# Patient Record
Sex: Male | Born: 1965
Health system: Southern US, Community
[De-identification: ages and names within clinical notes are randomized; demographics above are authoritative.]

## PROBLEM LIST (undated history)

## (undated) DIAGNOSIS — F1410 Cocaine abuse, uncomplicated: Secondary | ICD-10-CM

## (undated) DIAGNOSIS — Z720 Tobacco use: Secondary | ICD-10-CM

## (undated) DIAGNOSIS — I10 Essential (primary) hypertension: Secondary | ICD-10-CM

## (undated) DIAGNOSIS — G4730 Sleep apnea, unspecified: Secondary | ICD-10-CM

## (undated) DIAGNOSIS — I471 Supraventricular tachycardia, unspecified: Secondary | ICD-10-CM

## (undated) DIAGNOSIS — F1010 Alcohol abuse, uncomplicated: Secondary | ICD-10-CM

## (undated) DIAGNOSIS — R931 Abnormal findings on diagnostic imaging of heart and coronary circulation: Secondary | ICD-10-CM

## (undated) DIAGNOSIS — E119 Type 2 diabetes mellitus without complications: Secondary | ICD-10-CM

## (undated) HISTORY — PX: COLOSTOMY TAKEDOWN: SHX5783

---

## 1993-02-25 HISTORY — PX: COLOSTOMY: SHX63

## 2019-04-15 ENCOUNTER — Other Ambulatory Visit: Payer: Self-pay

## 2019-04-15 ENCOUNTER — Emergency Department (HOSPITAL_COMMUNITY)
Admission: EM | Admit: 2019-04-15 | Discharge: 2019-04-16 | Disposition: A | Discharge status: Home / Self Care | Payer: Self-pay | Attending: Emergency Medicine | Admitting: Emergency Medicine

## 2019-04-15 DIAGNOSIS — Z4800 Encounter for change or removal of nonsurgical wound dressing: Secondary | ICD-10-CM | POA: Insufficient documentation

## 2019-04-15 MED ORDER — BACITRACIN ZINC 500 UNIT/GM EX OINT
TOPICAL_OINTMENT | CUTANEOUS | Status: AC
  Filled 2019-04-15: qty 0.9

## 2019-04-15 NOTE — ED Provider Notes (Signed)
[facility] DEPT Provider Note   CSN: [id number] Arrival date & time: 04/15/19  2331     History Chief Complaint  Patient presents with  . Dressing Change    Curtis Wilkins is a 54 y.o. male.  The history is provided by the patient and medical records.   54 year old male requesting dressing change.  He had a sebaceous cyst of right axilla removed at [facility] a few days ago.  He has not changed the bandage since that time but wanted it checked before returning to work.  He is not currently on any type of antibiotic.  He has not had any increased pain, fever, or chills.  He has no other complaints.  No past medical history on file.  There are no problems to display for this patient.       No family history on file.  Social History   Tobacco Use  . Smoking status: Not on file  Substance Use Topics  . Alcohol use: Not on file  . Drug use: Not on file    Home Medications Prior to Admission medications   Not on File    Allergies    Patient has no allergy information on record.  Review of Systems   Review of Systems  Skin: Positive for wound.  All other systems reviewed and are negative.   Physical Exam Updated Vital Signs BP (!) 198/121   Pulse (!) 101   Temp 98.2 F (36.8 C)   Resp 20   Ht 5\' 9"  (1.753 m)   Wt 86.2 kg   SpO2 97%   BMI 28.06 kg/m   Physical Exam Vitals and nursing note reviewed.  Constitutional:      Appearance: He is well-developed.  HENT:     Head: Normocephalic and atraumatic.  Eyes:     Conjunctiva/sclera: Conjunctivae normal.     Pupils: Pupils are equal, round, and reactive to light.  Cardiovascular:     Rate and Rhythm: Normal rate and regular rhythm.     Heart sounds: Normal heart sounds.  Pulmonary:     Effort: Pulmonary effort is normal.     Breath sounds: Normal breath sounds.  Abdominal:     General: Bowel sounds are normal.     Palpations: Abdomen is soft.    Musculoskeletal:        General: Normal range of motion.     Cervical back: Normal range of motion.     Comments: Gauze and Tegaderm in right axilla-- this was removed and wound appears clean, there is no drainage or bleeding, surrounding skin is clean, dry, intact without cellulitis  Skin:    General: Skin is warm and dry.  Neurological:     Mental Status: He is alert and oriented to person, place, and time.     ED Results / Procedures / Treatments   Labs (all labs ordered are listed, but only abnormal results are displayed) Labs Reviewed - No data to display  EKG None  Radiology No results found.  Procedures Procedures (including critical care time)  Medications Ordered in ED Medications - No data to display  ED Course  I have reviewed the triage vital signs and the nursing notes.  Pertinent labs & imaging results that were available during my care of the patient were reviewed by me and considered in my medical decision making (see chart for details).    MDM Rules/Calculators/A&P  54 y.o. M here requesting dressing change after having sebacous cyst  removed at [facility] a few days ago.  He is due to return to work and wanted it checked.  He is afebrile, non-toxic.  Gauze and tegaderm removed from right axilla-- area is clean, dry, intact without any signs of infection.  No bleeding or drainage.  No cellulitic changes.  Dressing changed here and given extra supplies, instructed on how to properly change dressing at home.  Can follow-up with primary care doctor.  Return here for any new/acute changes.  Final Clinical Impression(s) / ED Diagnoses Final diagnoses:  Dressing change    Rx / DC Orders ED Discharge Orders    None       Garlon Hatchet, PA-C 04/16/19 0003    Nira Conn, MD 04/16/19 [phone]

## 2019-04-15 NOTE — ED Triage Notes (Signed)
Pt presents to ED from home with request to have his post op dressing change before he starts back to work because he doesn't want it to get infected. No c/o of injury or illness.

## 2019-04-16 NOTE — Discharge Instructions (Addendum)
Continue changing dressings as instructed.  I would try to change at least once a day, preferably after you shower. Follow-up with your primary care doctor. Return here for any new/acute changes.

## 2019-04-16 NOTE — ED Notes (Signed)
Pt assisted by provider in change of surgical bandage from cyst removal from right axilla. This nurse provided pt with bacitracin ointment and reinforced education on changing bandage and keeping area clean.

## 2019-04-30 ENCOUNTER — Other Ambulatory Visit: Payer: Self-pay

## 2019-04-30 ENCOUNTER — Emergency Department (HOSPITAL_COMMUNITY): Payer: Self-pay

## 2019-04-30 ENCOUNTER — Inpatient Hospital Stay (HOSPITAL_COMMUNITY)
Admission: EM | Admit: 2019-04-30 | Discharge: 2019-05-03 | DRG: 287 | Disposition: A | Discharge status: Home / Self Care | Payer: Self-pay | Attending: Cardiology | Admitting: Cardiology

## 2019-04-30 ENCOUNTER — Encounter (HOSPITAL_COMMUNITY): Payer: Self-pay | Admitting: Emergency Medicine

## 2019-04-30 DIAGNOSIS — I10 Essential (primary) hypertension: Secondary | ICD-10-CM | POA: Diagnosis present

## 2019-04-30 DIAGNOSIS — R079 Chest pain, unspecified: Secondary | ICD-10-CM | POA: Diagnosis present

## 2019-04-30 DIAGNOSIS — I471 Supraventricular tachycardia: Secondary | ICD-10-CM | POA: Diagnosis present

## 2019-04-30 DIAGNOSIS — R0789 Other chest pain: Secondary | ICD-10-CM

## 2019-04-30 DIAGNOSIS — I160 Hypertensive urgency: Secondary | ICD-10-CM

## 2019-04-30 DIAGNOSIS — E876 Hypokalemia: Secondary | ICD-10-CM | POA: Diagnosis present

## 2019-04-30 DIAGNOSIS — Z7984 Long term (current) use of oral hypoglycemic drugs: Secondary | ICD-10-CM

## 2019-04-30 DIAGNOSIS — R931 Abnormal findings on diagnostic imaging of heart and coronary circulation: Secondary | ICD-10-CM

## 2019-04-30 DIAGNOSIS — F17210 Nicotine dependence, cigarettes, uncomplicated: Secondary | ICD-10-CM | POA: Diagnosis present

## 2019-04-30 DIAGNOSIS — F1410 Cocaine abuse, uncomplicated: Secondary | ICD-10-CM | POA: Diagnosis present

## 2019-04-30 DIAGNOSIS — I161 Hypertensive emergency: Principal | ICD-10-CM | POA: Diagnosis present

## 2019-04-30 DIAGNOSIS — Z9114 Patient's other noncompliance with medication regimen: Secondary | ICD-10-CM

## 2019-04-30 DIAGNOSIS — R519 Headache, unspecified: Secondary | ICD-10-CM | POA: Diagnosis present

## 2019-04-30 DIAGNOSIS — G4733 Obstructive sleep apnea (adult) (pediatric): Secondary | ICD-10-CM | POA: Diagnosis present

## 2019-04-30 DIAGNOSIS — I252 Old myocardial infarction: Secondary | ICD-10-CM

## 2019-04-30 DIAGNOSIS — F1911 Other psychoactive substance abuse, in remission: Secondary | ICD-10-CM

## 2019-04-30 DIAGNOSIS — Z833 Family history of diabetes mellitus: Secondary | ICD-10-CM

## 2019-04-30 DIAGNOSIS — E119 Type 2 diabetes mellitus without complications: Secondary | ICD-10-CM | POA: Diagnosis present

## 2019-04-30 DIAGNOSIS — Z79899 Other long term (current) drug therapy: Secondary | ICD-10-CM

## 2019-04-30 DIAGNOSIS — Z8673 Personal history of transient ischemic attack (TIA), and cerebral infarction without residual deficits: Secondary | ICD-10-CM

## 2019-04-30 DIAGNOSIS — R778 Other specified abnormalities of plasma proteins: Secondary | ICD-10-CM

## 2019-04-30 DIAGNOSIS — Z20822 Contact with and (suspected) exposure to covid-19: Secondary | ICD-10-CM | POA: Diagnosis present

## 2019-04-30 HISTORY — DX: Cocaine abuse, uncomplicated: F14.10

## 2019-04-30 HISTORY — DX: Sleep apnea, unspecified: G47.30

## 2019-04-30 HISTORY — DX: Supraventricular tachycardia: I47.1

## 2019-04-30 HISTORY — DX: Tobacco use: Z72.0

## 2019-04-30 HISTORY — DX: Type 2 diabetes mellitus without complications: E11.9

## 2019-04-30 HISTORY — DX: Alcohol abuse, uncomplicated: F10.10

## 2019-04-30 HISTORY — DX: Abnormal findings on diagnostic imaging of heart and coronary circulation: R93.1

## 2019-04-30 HISTORY — DX: Essential (primary) hypertension: I10

## 2019-04-30 HISTORY — DX: Supraventricular tachycardia, unspecified: I47.10

## 2019-04-30 LAB — BASIC METABOLIC PANEL
Anion gap: 10 (ref 5–15)
BUN: 15 mg/dL (ref 6–20)
CO2: 26 mmol/L (ref 22–32)
Calcium: 9.2 mg/dL (ref 8.9–10.3)
Chloride: 103 mmol/L (ref 98–111)
Creatinine, Ser: 1.08 mg/dL (ref 0.61–1.24)
GFR calc Af Amer: >60 mL/min (ref >60)
GFR calc non Af Amer: >60 mL/min (ref >60)
Glucose, Bld: 192 mg/dL — ABNORMAL HIGH (ref 70–99)
Potassium: 3.3 mmol/L — ABNORMAL LOW (ref 3.5–5.1)
Sodium: 139 mmol/L (ref 135–145)

## 2019-04-30 LAB — RESPIRATORY PANEL BY RT PCR (FLU A&B, COVID)
Influenza A by PCR: NEGATIVE
Influenza B by PCR: NEGATIVE
SARS Coronavirus 2 by RT PCR: NEGATIVE

## 2019-04-30 LAB — CBC
HCT: 41.4 % (ref 39.0–52.0)
Hemoglobin: 12.9 g/dL — ABNORMAL LOW (ref 13.0–17.0)
MCH: 27.6 pg (ref 26.0–34.0)
MCHC: 31.2 g/dL (ref 30.0–36.0)
MCV: 88.7 fL (ref 80.0–100.0)
Platelets: 272 10*3/uL (ref 150–400)
RBC: 4.67 MIL/uL (ref 4.22–5.81)
RDW: 14.3 % (ref 11.5–15.5)
WBC: 7.2 10*3/uL (ref 4.0–10.5)
nRBC: 0 % (ref 0.0–0.2)

## 2019-04-30 LAB — HEMOGLOBIN A1C
Hgb A1c MFr Bld: 7.1 % — ABNORMAL HIGH (ref 4.8–5.6)
Mean Plasma Glucose: 157.07 mg/dL

## 2019-04-30 LAB — GLUCOSE, CAPILLARY
Glucose-Capillary: 189 mg/dL — ABNORMAL HIGH (ref 70–99)
Glucose-Capillary: 164 mg/dL — ABNORMAL HIGH (ref 70–99)
Glucose-Capillary: 162 mg/dL — ABNORMAL HIGH (ref 70–99)

## 2019-04-30 LAB — ECHOCARDIOGRAM COMPLETE
Height: 69 in
Weight: 3039.98 oz

## 2019-04-30 LAB — TROPONIN I (HIGH SENSITIVITY)
Troponin I (High Sensitivity): 325 ng/L (ref <18)
Troponin I (High Sensitivity): 286 ng/L (ref <18)
Troponin I (High Sensitivity): 292 ng/L (ref <18)
Troponin I (High Sensitivity): 318 ng/L (ref <18)

## 2019-04-30 LAB — HIV ANTIBODY (ROUTINE TESTING W REFLEX): HIV Screen 4th Generation wRfx: NONREACTIVE

## 2019-04-30 MED ORDER — MORPHINE SULFATE (PF) 2 MG/ML IV SOLN
2.0000 mg | INTRAVENOUS | Status: DC | PRN
  Administered 2019-04-30 – 2019-05-01 (×2): 2 mg via INTRAVENOUS
  Filled 2019-04-30 (×2): qty 1

## 2019-04-30 MED ORDER — ASPIRIN EC 81 MG PO TBEC
81.0000 mg | DELAYED_RELEASE_TABLET | Freq: Every day | ORAL | Status: DC
  Administered 2019-05-01 – 2019-05-02 (×2): 81 mg via ORAL
  Filled 2019-04-30 (×2): qty 1

## 2019-04-30 MED ORDER — MORPHINE SULFATE (PF) 4 MG/ML IV SOLN
4.0000 mg | Freq: Once | INTRAVENOUS | Status: AC
  Administered 2019-04-30: 4 mg via INTRAVENOUS
  Filled 2019-04-30: qty 1

## 2019-04-30 MED ORDER — POTASSIUM CHLORIDE CRYS ER 20 MEQ PO TBCR
40.0000 meq | EXTENDED_RELEASE_TABLET | Freq: Once | ORAL | Status: AC
  Administered 2019-04-30: 40 meq via ORAL
  Filled 2019-04-30: qty 2

## 2019-04-30 MED ORDER — AMLODIPINE BESYLATE 5 MG PO TABS
5.0000 mg | ORAL_TABLET | Freq: Every day | ORAL | Status: DC
  Administered 2019-04-30: 5 mg via ORAL
  Filled 2019-04-30: qty 1

## 2019-04-30 MED ORDER — ACETAMINOPHEN 325 MG PO TABS
650.0000 mg | ORAL_TABLET | ORAL | Status: DC | PRN
  Administered 2019-05-02: 650 mg via ORAL
  Filled 2019-04-30: qty 2

## 2019-04-30 MED ORDER — ENOXAPARIN SODIUM 40 MG/0.4ML ~~LOC~~ SOLN: 40.0000 mg | SUBCUTANEOUS | Status: DC

## 2019-04-30 MED ORDER — ATORVASTATIN CALCIUM 80 MG PO TABS
80.0000 mg | ORAL_TABLET | Freq: Every day | ORAL | Status: DC
  Administered 2019-04-30 – 2019-05-03 (×4): 80 mg via ORAL
  Filled 2019-04-30 (×4): qty 1

## 2019-04-30 MED ORDER — HYDRALAZINE HCL 10 MG PO TABS
10.0000 mg | ORAL_TABLET | Freq: Four times a day (QID) | ORAL | Status: DC | PRN
  Administered 2019-04-30 – 2019-05-03 (×6): 10 mg via ORAL
  Filled 2019-04-30 (×6): qty 1

## 2019-04-30 MED ORDER — INSULIN ASPART 100 UNIT/ML ~~LOC~~ SOLN
0.0000 [IU] | Freq: Three times a day (TID) | SUBCUTANEOUS | Status: DC
  Administered 2019-04-30: 3 [IU] via SUBCUTANEOUS
  Administered 2019-05-01 – 2019-05-03 (×3): 2 [IU] via SUBCUTANEOUS

## 2019-04-30 MED ORDER — ENOXAPARIN SODIUM 40 MG/0.4ML ~~LOC~~ SOLN
40.0000 mg | SUBCUTANEOUS | Status: DC
  Filled 2019-04-30: qty 0.4

## 2019-04-30 MED ORDER — ONDANSETRON HCL 4 MG/2ML IJ SOLN: 4.0000 mg | Freq: Four times a day (QID) | INTRAMUSCULAR | Status: DC | PRN

## 2019-04-30 MED ORDER — IOHEXOL 350 MG/ML SOLN
75.0000 mL | Freq: Once | INTRAVENOUS | Status: AC | PRN
  Administered 2019-04-30: 75 mL via INTRAVENOUS

## 2019-04-30 MED ORDER — LABETALOL HCL 5 MG/ML IV SOLN
20.0000 mg | INTRAVENOUS | Status: DC | PRN
  Administered 2019-04-30: 20 mg via INTRAVENOUS
  Filled 2019-04-30: qty 4

## 2019-04-30 MED ORDER — LOSARTAN POTASSIUM 50 MG PO TABS
50.0000 mg | ORAL_TABLET | Freq: Every day | ORAL | Status: DC
  Administered 2019-04-30 – 2019-05-01 (×2): 50 mg via ORAL
  Filled 2019-04-30 (×2): qty 1

## 2019-04-30 MED ORDER — NITROGLYCERIN 0.4 MG SL SUBL
0.4000 mg | SUBLINGUAL_TABLET | SUBLINGUAL | Status: DC | PRN
  Filled 2019-04-30: qty 1

## 2019-04-30 MED ORDER — ASPIRIN 81 MG PO CHEW: 324.0000 mg | CHEWABLE_TABLET | Freq: Once | ORAL | Status: DC

## 2019-04-30 NOTE — ED Triage Notes (Signed)
Pt arrives via EMS from home with CP since yesterday. Pain worse with deep breaths, movement and talking. Noncompliant with HTN meds. 324 ASA given by EMS but pt refused nitro.

## 2019-04-30 NOTE — Care Management (Signed)
5625 04-30-19 Case Manager received consult for medication, primary care provider (PCP) and CPAP assistance. Patient was previously in [facility] and now has been in [place] for a couple of weeks. Patient is living at [facility]- in an apartment. Director is [employer] @ [phone]. Patient currently works at [employer], but has not been on the job long enough for [employer] Patient gets transportation through a [employer] through [employer]. Case Manager did get verbal permission to contact [place] regarding transportation to appointments and they can transport the patient. Case Manager scheduled a hospital follow up appointment at the [facility]-information placed on the AVS. Patient will be able to utilize the pharmacy M-F for medication assistance. If the patient is discharged over the weekend, patient will need to use a local pharmacy for medications. Weekend Case Manager to follow for medication needs and possible MATCH. Patient states he had a sleep study at [facility] within the last 5 years. Case Manager is unable to see this information- unable to see care everywhere. CPAP cost is $853.00 and patient is not able to pay this amount at this time. Patient states he has worn CPAP's at [facility] and he hates the mask. Case Manager will continue to follow for additional transition of care needs. Gala Lewandowsky, RN,BSN Case Manager
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
## 2019-04-30 NOTE — Progress Notes (Signed)
Called that pt was having more chest pain.  He refused NTG due to bad headache.  Also BP still elevated.  I ordered IV morphine, EKG is stable, I ordered prn hydralazine po as well.  Also repeated hs troponins.

## 2019-04-30 NOTE — Progress Notes (Signed)
  Echocardiogram 2D Echocardiogram was attempted but the patient was in CT.   Curtis Wilkins 04/30/2019, 12:48 PM

## 2019-04-30 NOTE — ED Notes (Signed)
Went to ct 

## 2019-04-30 NOTE — Progress Notes (Signed)
During shift change patient stated that he was having chest chain 8 out 10 that was radiating to his back on the left side. EKG was conducted and patient refused nitroglycerin, stated that it gives him a headache. Cardiology was paged and awaiting orders.

## 2019-04-30 NOTE — Progress Notes (Signed)
  Echocardiogram 2D Echocardiogram has been performed.  Curtis Wilkins 04/30/2019, 1:51 PM

## 2019-04-30 NOTE — ED Notes (Signed)
Pt transported to CT ?

## 2019-05-01 DIAGNOSIS — R0789 Other chest pain: Secondary | ICD-10-CM

## 2019-05-01 LAB — LIPID PANEL
Cholesterol: 133 mg/dL (ref 0–200)
HDL: 55 mg/dL (ref >40)
LDL Cholesterol: 72 mg/dL (ref 0–99)
Total CHOL/HDL Ratio: 2.4 RATIO
Triglycerides: 32 mg/dL (ref <150)
VLDL: 6 mg/dL (ref 0–40)

## 2019-05-01 LAB — BASIC METABOLIC PANEL
Anion gap: 10 (ref 5–15)
BUN: 13 mg/dL (ref 6–20)
CO2: 27 mmol/L (ref 22–32)
Calcium: 8.7 mg/dL — ABNORMAL LOW (ref 8.9–10.3)
Chloride: 103 mmol/L (ref 98–111)
Creatinine, Ser: 1.09 mg/dL (ref 0.61–1.24)
GFR calc Af Amer: >60 mL/min (ref >60)
GFR calc non Af Amer: >60 mL/min (ref >60)
Glucose, Bld: 133 mg/dL — ABNORMAL HIGH (ref 70–99)
Potassium: 3.6 mmol/L (ref 3.5–5.1)
Sodium: 140 mmol/L (ref 135–145)

## 2019-05-01 LAB — RAPID URINE DRUG SCREEN, HOSP PERFORMED
Amphetamines: NOT DETECTED
Barbiturates: NOT DETECTED
Benzodiazepines: NOT DETECTED
Cocaine: NOT DETECTED
Opiates: POSITIVE — AB
Tetrahydrocannabinol: NOT DETECTED

## 2019-05-01 LAB — GLUCOSE, CAPILLARY
Glucose-Capillary: 114 mg/dL — ABNORMAL HIGH (ref 70–99)
Glucose-Capillary: 147 mg/dL — ABNORMAL HIGH (ref 70–99)
Glucose-Capillary: 149 mg/dL — ABNORMAL HIGH (ref 70–99)
Glucose-Capillary: 185 mg/dL — ABNORMAL HIGH (ref 70–99)

## 2019-05-01 MED ORDER — ALUM & MAG HYDROXIDE-SIMETH 200-200-20 MG/5ML PO SUSP
30.0000 mL | Freq: Once | ORAL | Status: AC
  Administered 2019-05-01: 30 mL via ORAL
  Filled 2019-05-01: qty 30

## 2019-05-01 MED ORDER — AMLODIPINE BESYLATE 10 MG PO TABS
10.0000 mg | ORAL_TABLET | Freq: Every day | ORAL | Status: DC
  Administered 2019-05-01 – 2019-05-03 (×3): 10 mg via ORAL
  Filled 2019-05-01 (×3): qty 1

## 2019-05-01 NOTE — Progress Notes (Signed)
Progress Note  Patient Name: Curtis Wilkins Date of Encounter: 05/01/2019  Primary Cardiologist: New  Subjective   Some ongoing chest pain symptoms, some pain with swallowing.   Inpatient Medications    Scheduled Meds: . amLODipine  5 mg Oral Daily  . aspirin  324 mg Oral Once  . aspirin EC  81 mg Oral Daily  . atorvastatin  80 mg Oral q1800  . enoxaparin (LOVENOX) injection  40 mg Subcutaneous Q24H  . insulin aspart  0-15 Units Subcutaneous TID WC  . losartan  50 mg Oral Daily   Continuous Infusions:  PRN Meds: acetaminophen, hydrALAZINE, morphine injection, nitroGLYCERIN, ondansetron (ZOFRAN) IV   Vital Signs    Vitals:   04/30/19 1800 04/30/19 2026 05/01/19 0000 05/01/19 0341  BP: (!) 177/106 (!) 148/103 (!) 149/95 (!) 142/98  Pulse:    (!) 59  Resp:      Temp:      TempSrc:      SpO2:      Weight:      Height:        Intake/Output Summary (Last 24 hours) at 05/01/2019 0719 Last data filed at 05/01/2019 0353 Gross per 24 hour  Intake 820 ml  Output 800 ml  Net 20 ml   Last 3 Weights 04/30/2019 04/15/2019  Weight (lbs) 190 lb 190 lb  Weight (kg) 86.183 kg 86.183 kg      Telemetry    SR, rare PVCs- Personally Reviewed  ECG    n/a - Personally Reviewed  Physical Exam   GEN: No acute distress.   Neck: No JVD Cardiac: RRR, no murmurs, rubs, or gallops.  Respiratory: Clear to auscultation bilaterally. GI: Soft, nontender, non-distended  MS: No edema; No deformity. Neuro:  Nonfocal  Psych: Normal affect   Labs    High Sensitivity Troponin:   Recent Labs  Lab 04/30/19 0832 04/30/19 1059 04/30/19 2030 04/30/19 2235  TROPONINIHS 325* 286* 292* 318*      Chemistry Recent Labs  Lab 04/30/19 0832 05/01/19 0345  NA 139 140  K 3.3* 3.6  CL 103 103  CO2 26 27  GLUCOSE 192* 133*  BUN 15 13  CREATININE 1.08 1.09  CALCIUM 9.2 8.7*  GFRNONAA >60 >60  GFRAA >60 >60  ANIONGAP 10 10     Hematology Recent Labs  Lab 04/30/19 0832  WBC 7.2   RBC 4.67  HGB 12.9*  HCT 41.4  MCV 88.7  MCH 27.6  MCHC 31.2  RDW 14.3  PLT 272    BNPNo results for input(s): BNP, PROBNP in the last 168 hours.   DDimer No results for input(s): DDIMER in the last 168 hours.   Radiology    DG Chest 2 View  Result Date: 04/30/2019 CLINICAL DATA:  Chest pain. EXAM: CHEST - 2 VIEW COMPARISON:  None. FINDINGS: Mild cardiomegaly. No pneumothorax or pleural effusion is noted. Both lungs are clear. The visualized skeletal structures are unremarkable. IMPRESSION: No active cardiopulmonary disease. Electronically Signed   By: Lupita Raider M.D.   On: 04/30/2019 09:13   CT Angio Chest Aorta w/CM &/OR wo/CM  Result Date: 04/30/2019 CLINICAL DATA:  Chest pain EXAM: CT ANGIOGRAPHY CHEST WITH CONTRAST TECHNIQUE: Multidetector CT imaging of the chest was performed using the standard protocol during bolus administration of intravenous contrast. Multiplanar CT image reconstructions and MIPs were obtained to evaluate the vascular anatomy. CONTRAST:  25mL OMNIPAQUE IOHEXOL 350 MG/ML SOLN COMPARISON:  None. FINDINGS: Cardiovascular: Preferential opacification of the thoracic aorta.  There is no evidence of aortic dissection. Ectasia of the ascending thoracic aorta measuring 3.9 cm. No aneurysm. Normal heart size. No pericardial effusion. Mediastinum/Nodes: There is no hilar, mediastinal, or axillary adenopathy. Lungs/Pleura: Mild bibasilar atelectasis. No consolidation or mass. No pleural effusion or pneumothorax. Upper Abdomen: No acute abnormality. Musculoskeletal: No chest wall abnormality. No acute or significant osseous findings. Review of the MIP images confirms the above findings. IMPRESSION: No evidence of aortic dissection or other acute abnormality. Electronically Signed   By: Guadlupe Spanish M.D.   On: 04/30/2019 13:18   ECHOCARDIOGRAM COMPLETE  Result Date: 04/30/2019    ECHOCARDIOGRAM REPORT   Patient Name:   Curtis Wilkins Date of Exam: 04/30/2019 Medical Rec #:   [id number]      Height:       69.0 in Accession #:    [id number]     Weight:       190.0 lb Date of Birth:  1966-02-14       BSA:          2.021 m Patient Age:    54 years       BP:           140/97 mmHg Patient Gender: M              HR:           52 bpm. Exam Location:  Inpatient Procedure: 2D Echo Indications:    Chest Pain R07.9  History:        Patient has no prior history of Echocardiogram examinations.                 Risk Factors:Hypertension, Diabetes, Current Smoker and Cocaine                 abuse.  Sonographer:    Thurman Coyer RDCS (AE) Referring Phys: [id number] CALLIE E GOODRICH IMPRESSIONS  1. Low normal LV systolic function; severe LVH; grade 2 diastolic dysfunction; mild LAE; trace AI; speckled appearance of myocardium; suggest cardiac MRI or PYP scan to R/O amyloid.  2. Left ventricular ejection fraction, by estimation, is 50 to 55%. The left ventricle has low normal function. The left ventricle has no regional wall motion abnormalities. There is severe left ventricular hypertrophy. Left ventricular diastolic parameters are consistent with Grade II diastolic dysfunction (pseudonormalization). Elevated left atrial pressure.  3. Right ventricular systolic function is normal. The right ventricular size is normal.  4. Left atrial size was mildly dilated.  5. The mitral valve is normal in structure and function. Trivial mitral valve regurgitation. No evidence of mitral stenosis.  6. The aortic valve is tricuspid. Aortic valve regurgitation is trivial. No aortic stenosis is present.  7. The inferior vena cava is normal in size with greater than 50% respiratory variability, suggesting right atrial pressure of 3 mmHg. FINDINGS  Left Ventricle: Left ventricular ejection fraction, by estimation, is 50 to 55%. The left ventricle has low normal function. The left ventricle has no regional wall motion abnormalities. The left ventricular internal cavity size was normal in size. There is severe left ventricular  hypertrophy. Left ventricular diastolic parameters are consistent with Grade II diastolic dysfunction (pseudonormalization). Elevated left atrial pressure. Right Ventricle: The right ventricular size is normal. Right ventricular systolic function is normal. Left Atrium: Left atrial size was mildly dilated. Right Atrium: Right atrial size was normal in size. Pericardium: Trivial pericardial effusion is present. Mitral Valve: The mitral valve is normal in structure and function. Normal mobility of  the mitral valve leaflets. Trivial mitral valve regurgitation. No evidence of mitral valve stenosis. Tricuspid Valve: The tricuspid valve is normal in structure. Tricuspid valve regurgitation is trivial. No evidence of tricuspid stenosis. Aortic Valve: The aortic valve is tricuspid. Aortic valve regurgitation is trivial. No aortic stenosis is present. Pulmonic Valve: The pulmonic valve was normal in structure. Pulmonic valve regurgitation is not visualized. No evidence of pulmonic stenosis. Aorta: The aortic root is normal in size and structure. Venous: The inferior vena cava is normal in size with greater than 50% respiratory variability, suggesting right atrial pressure of 3 mmHg. IAS/Shunts: No atrial level shunt detected by color flow Doppler. Additional Comments: Low normal LV systolic function; severe LVH; grade 2 diastolic dysfunction; mild LAE; trace AI; speckled appearance of myocardium; suggest cardiac MRI or PYP scan to R/O amyloid.  LEFT VENTRICLE PLAX 2D LVIDd:         4.80 cm  Diastology LVIDs:         3.20 cm  LV e' lateral:   4.57 cm/s LV PW:         2.00 cm  LV E/e' lateral: 20.6 LV IVS:        1.70 cm  LV e' medial:    4.13 cm/s LVOT diam:     2.30 cm  LV E/e' medial:  22.8 LV SV:         88 LV SV Index:   43 LVOT Area:     4.15 cm  RIGHT VENTRICLE RV S prime:     13.40 cm/s TAPSE (M-mode): 2.1 cm LEFT ATRIUM             Index       RIGHT ATRIUM           Index LA diam:        4.80 cm 2.37 cm/m  RA Area:      15.50 cm LA Vol (A2C):   51.5 ml 25.48 ml/m RA Volume:   37.10 ml  18.35 ml/m LA Vol (A4C):   77.8 ml 38.49 ml/m LA Biplane Vol: 65.5 ml 32.40 ml/m  AORTIC VALVE LVOT Vmax:   96.90 cm/s LVOT Vmean:  65.600 cm/s LVOT VTI:    0.211 m  AORTA Ao Root diam: 3.60 cm MITRAL VALVE MV Area (PHT): 2.66 cm    SHUNTS MV Decel Time: 285 msec    Systemic VTI:  0.21 m MV E velocity: 94.00 cm/s  Systemic Diam: 2.30 cm MV A velocity: 73.70 cm/s MV E/A ratio:  1.28 Kirk Ruths MD Electronically signed by Kirk Ruths MD Signature Date/Time: 04/30/2019/4:45:11 PM    Final     Cardiac Studies    Patient Profile     Curtis Wilkins is a 54 y.o. male with a history of two self-reported MIs (cardiac catheterization reportedly showed normal coronaries), stroke, hypertension non-compliant with medications, recently diagnosed type 2 diabetes mellitus, sleep apnea not on CPAP, and polysubstance abuse (tobacco, alcohol, and cocaine) who presents today for chest pain.  Assessment & Plan    1. Chest pain with elevated troponin - in setting of severe HTN, SBPs >200 - peak trop 325, slow downtrend then recurrent uptrend to 318 - EKG LVH,lateral and inferior TWIs possible strain - echo severe LVH, grade II DDX, LVEF 50-55%. Speckled appearance of myocardium reported, recs for MRI or PYP scan. Can plan on as outpatient - Patient states he has had two MIs before but reportedly had no CAD on cardiac catheterization either time. Both  of these episodes occurred at outside hospitals - CTA no aortic dissection  - from admission note plan is for cath on Monday - current medical therapy with norvasc 5, ASA 81, atorva 80, losartan 50. No beta blocker due to bradycardia - ongoing pain, remains atypical. This AM reports some pain with eating/swallowing, will try GI cocktail. Pain also somewhat worst with deep breathing and movement.    2. History of substance abuse - patien reports last time he used cocaine was 2 months  ago - UDS here not collected, reorder   3. Hypertensive urgency - SBPs in 220s on admission, noncompliant with home meds - bp's improving but   remain elevated, increase norvasc to 10mg  daily   We will need to get social workers or case workers involved to see if we can help him with medications and if there is any way he can get help with CPAP.  Social situation: He is temporarily in [place] and could benefit from primary care provider if he is going to be staying locally for any length of time   For questions or updates, please contact [facility] Please consult [email or web address] for contact info under        Signed, [place], MD  05/01/2019, 7:19 AM

## 2019-05-02 LAB — GLUCOSE, CAPILLARY
Glucose-Capillary: 207 mg/dL — ABNORMAL HIGH (ref 70–99)
Glucose-Capillary: 107 mg/dL — ABNORMAL HIGH (ref 70–99)
Glucose-Capillary: 154 mg/dL — ABNORMAL HIGH (ref 70–99)
Glucose-Capillary: 217 mg/dL — ABNORMAL HIGH (ref 70–99)

## 2019-05-02 MED ORDER — ASPIRIN 81 MG PO CHEW
81.0000 mg | CHEWABLE_TABLET | ORAL | Status: AC
  Administered 2019-05-03: 81 mg via ORAL
  Filled 2019-05-02: qty 1

## 2019-05-02 MED ORDER — SODIUM CHLORIDE 0.9% FLUSH
3.0000 mL | Freq: Two times a day (BID) | INTRAVENOUS | Status: DC
  Administered 2019-05-02 – 2019-05-03 (×2): 3 mL via INTRAVENOUS

## 2019-05-02 MED ORDER — SODIUM CHLORIDE 0.9 % WEIGHT BASED INFUSION
3.0000 mL/kg/h | INTRAVENOUS | Status: DC
  Administered 2019-05-03: 3 mL/kg/h via INTRAVENOUS

## 2019-05-02 MED ORDER — SODIUM CHLORIDE 0.9% FLUSH: 3.0000 mL | INTRAVENOUS | Status: DC | PRN

## 2019-05-02 MED ORDER — SODIUM CHLORIDE 0.9 % WEIGHT BASED INFUSION
1.0000 mL/kg/h | INTRAVENOUS | Status: DC
  Administered 2019-05-03: 1 mL/kg/h via INTRAVENOUS

## 2019-05-02 MED ORDER — SODIUM CHLORIDE 0.9 % IV SOLN: 250.0000 mL | INTRAVENOUS | Status: DC | PRN

## 2019-05-02 MED ORDER — LOSARTAN POTASSIUM 50 MG PO TABS
100.0000 mg | ORAL_TABLET | Freq: Every day | ORAL | Status: DC
  Administered 2019-05-02 – 2019-05-03 (×2): 100 mg via ORAL
  Filled 2019-05-02 (×2): qty 2

## 2019-05-02 NOTE — H&P (View-Only) (Signed)
Progress Note  Patient Name: Curtis Wilkins Date of Encounter: 05/02/2019  Primary Cardiologist: New  Subjective   Mild chest pain left sided  Inpatient Medications    Scheduled Meds: . amLODipine  10 mg Oral Daily  . aspirin  324 mg Oral Once  . aspirin EC  81 mg Oral Daily  . atorvastatin  80 mg Oral q1800  . enoxaparin (LOVENOX) injection  40 mg Subcutaneous Q24H  . insulin aspart  0-15 Units Subcutaneous TID WC  . losartan  50 mg Oral Daily   Continuous Infusions:  PRN Meds: acetaminophen, hydrALAZINE, morphine injection, nitroGLYCERIN, ondansetron (ZOFRAN) IV   Vital Signs    Vitals:   05/01/19 2114 05/01/19 2143 05/02/19 0336 05/02/19 0500  BP: (!) 178/103 (!) 176/108 (!) 169/106 (!) 147/107  Pulse: 79 71  63  Resp:  17  17  Temp:  98 F (36.7 C)  97.6 F (36.4 C)  TempSrc:  Oral  Oral  SpO2: 95% 98%  98%  Weight:    90.2 kg  Height:        Intake/Output Summary (Last 24 hours) at 05/02/2019 0800 Last data filed at 05/02/2019 0100 Gross per 24 hour  Intake 840 ml  Output 1625 ml  Net -785 ml   Last 3 Weights 05/02/2019 04/30/2019 04/15/2019  Weight (lbs) 198 lb 14.4 oz 190 lb 190 lb  Weight (kg) 90.22 kg 86.183 kg 86.183 kg      Telemetry    SR - Personally Reviewed  ECG    n/a- Personally Reviewed  Physical Exam   GEN: No acute distress.   Neck: No JVD Cardiac: RRR, no murmurs, rubs, or gallops.  Respiratory: Clear to auscultation bilaterally. GI: Soft, nontender, non-distended  MS: No edema; No deformity. Neuro:  Nonfocal  Psych: Normal affect   Labs    High Sensitivity Troponin:   Recent Labs  Lab 04/30/19 0832 04/30/19 1059 04/30/19 2030 04/30/19 2235  TROPONINIHS 325* 286* 292* 318*      Chemistry Recent Labs  Lab 04/30/19 0832 05/01/19 0345  NA 139 140  K 3.3* 3.6  CL 103 103  CO2 26 27  GLUCOSE 192* 133*  BUN 15 13  CREATININE 1.08 1.09  CALCIUM 9.2 8.7*  GFRNONAA >60 >60  GFRAA >60 >60  ANIONGAP 10 10      Hematology Recent Labs  Lab 04/30/19 0832  WBC 7.2  RBC 4.67  HGB 12.9*  HCT 41.4  MCV 88.7  MCH 27.6  MCHC 31.2  RDW 14.3  PLT 272    BNPNo results for input(s): BNP, PROBNP in the last 168 hours.   DDimer No results for input(s): DDIMER in the last 168 hours.   Radiology    DG Chest 2 View  Result Date: 04/30/2019 CLINICAL DATA:  Chest pain. EXAM: CHEST - 2 VIEW COMPARISON:  None. FINDINGS: Mild cardiomegaly. No pneumothorax or pleural effusion is noted. Both lungs are clear. The visualized skeletal structures are unremarkable. IMPRESSION: No active cardiopulmonary disease. Electronically Signed   By: Lupita Raider M.D.   On: 04/30/2019 09:13   CT Angio Chest Aorta w/CM &/OR wo/CM  Result Date: 04/30/2019 CLINICAL DATA:  Chest pain EXAM: CT ANGIOGRAPHY CHEST WITH CONTRAST TECHNIQUE: Multidetector CT imaging of the chest was performed using the standard protocol during bolus administration of intravenous contrast. Multiplanar CT image reconstructions and MIPs were obtained to evaluate the vascular anatomy. CONTRAST:  21mL OMNIPAQUE IOHEXOL 350 MG/ML SOLN COMPARISON:  None. FINDINGS: Cardiovascular:  Preferential opacification of the thoracic aorta. There is no evidence of aortic dissection. Ectasia of the ascending thoracic aorta measuring 3.9 cm. No aneurysm. Normal heart size. No pericardial effusion. Mediastinum/Nodes: There is no hilar, mediastinal, or axillary adenopathy. Lungs/Pleura: Mild bibasilar atelectasis. No consolidation or mass. No pleural effusion or pneumothorax. Upper Abdomen: No acute abnormality. Musculoskeletal: No chest wall abnormality. No acute or significant osseous findings. Review of the MIP images confirms the above findings. IMPRESSION: No evidence of aortic dissection or other acute abnormality. Electronically Signed   By: Guadlupe Spanish M.D.   On: 04/30/2019 13:18   ECHOCARDIOGRAM COMPLETE  Result Date: 04/30/2019    ECHOCARDIOGRAM REPORT   Patient  Name:   Curtis Wilkins Date of Exam: 04/30/2019 Medical Rec #:  [id number]      Height:       69.0 in Accession #:    [id number]     Weight:       190.0 lb Date of Birth:  [birth year]       BSA:          2.021 m Patient Age:    54 years       BP:           140/97 mmHg Patient Gender: M              HR:           52 bpm. Exam Location:  Inpatient Procedure: 2D Echo Indications:    Chest Pain R07.9  History:        Patient has no prior history of Echocardiogram examinations.                 Risk Factors:Hypertension, Diabetes, Current Smoker and Cocaine                 abuse.  Sonographer:    Thurman Coyer RDCS (AE) Referring Phys: [id number] CALLIE E GOODRICH IMPRESSIONS  1. Low normal LV systolic function; severe LVH; grade 2 diastolic dysfunction; mild LAE; trace AI; speckled appearance of myocardium; suggest cardiac MRI or PYP scan to R/O amyloid.  2. Left ventricular ejection fraction, by estimation, is 50 to 55%. The left ventricle has low normal function. The left ventricle has no regional wall motion abnormalities. There is severe left ventricular hypertrophy. Left ventricular diastolic parameters are consistent with Grade II diastolic dysfunction (pseudonormalization). Elevated left atrial pressure.  3. Right ventricular systolic function is normal. The right ventricular size is normal.  4. Left atrial size was mildly dilated.  5. The mitral valve is normal in structure and function. Trivial mitral valve regurgitation. No evidence of mitral stenosis.  6. The aortic valve is tricuspid. Aortic valve regurgitation is trivial. No aortic stenosis is present.  7. The inferior vena cava is normal in size with greater than 50% respiratory variability, suggesting right atrial pressure of 3 mmHg. FINDINGS  Left Ventricle: Left ventricular ejection fraction, by estimation, is 50 to 55%. The left ventricle has low normal function. The left ventricle has no regional wall motion abnormalities. The left ventricular internal  cavity size was normal in size. There is severe left ventricular hypertrophy. Left ventricular diastolic parameters are consistent with Grade II diastolic dysfunction (pseudonormalization). Elevated left atrial pressure. Right Ventricle: The right ventricular size is normal. Right ventricular systolic function is normal. Left Atrium: Left atrial size was mildly dilated. Right Atrium: Right atrial size was normal in size. Pericardium: Trivial pericardial effusion is present. Mitral Valve: The mitral valve is normal in  structure and function. Normal mobility of the mitral valve leaflets. Trivial mitral valve regurgitation. No evidence of mitral valve stenosis. Tricuspid Valve: The tricuspid valve is normal in structure. Tricuspid valve regurgitation is trivial. No evidence of tricuspid stenosis. Aortic Valve: The aortic valve is tricuspid. Aortic valve regurgitation is trivial. No aortic stenosis is present. Pulmonic Valve: The pulmonic valve was normal in structure. Pulmonic valve regurgitation is not visualized. No evidence of pulmonic stenosis. Aorta: The aortic root is normal in size and structure. Venous: The inferior vena cava is normal in size with greater than 50% respiratory variability, suggesting right atrial pressure of 3 mmHg. IAS/Shunts: No atrial level shunt detected by color flow Doppler. Additional Comments: Low normal LV systolic function; severe LVH; grade 2 diastolic dysfunction; mild LAE; trace AI; speckled appearance of myocardium; suggest cardiac MRI or PYP scan to R/O amyloid.  LEFT VENTRICLE PLAX 2D LVIDd:         4.80 cm  Diastology LVIDs:         3.20 cm  LV e' lateral:   4.57 cm/s LV PW:         2.00 cm  LV E/e' lateral: 20.6 LV IVS:        1.70 cm  LV e' medial:    4.13 cm/s LVOT diam:     2.30 cm  LV E/e' medial:  22.8 LV SV:         88 LV SV Index:   43 LVOT Area:     4.15 cm  RIGHT VENTRICLE RV S prime:     13.40 cm/s TAPSE (M-mode): 2.1 cm LEFT ATRIUM             Index       RIGHT  ATRIUM           Index LA diam:        4.80 cm 2.37 cm/m  RA Area:     15.50 cm LA Vol (A2C):   51.5 ml 25.48 ml/m RA Volume:   37.10 ml  18.35 ml/m LA Vol (A4C):   77.8 ml 38.49 ml/m LA Biplane Vol: 65.5 ml 32.40 ml/m  AORTIC VALVE LVOT Vmax:   96.90 cm/s LVOT Vmean:  65.600 cm/s LVOT VTI:    0.211 m  AORTA Ao Root diam: 3.60 cm MITRAL VALVE MV Area (PHT): 2.66 cm    SHUNTS MV Decel Time: 285 msec    Systemic VTI:  0.21 m MV E velocity: 94.00 cm/s  Systemic Diam: 2.30 cm MV A velocity: 73.70 cm/s MV E/A ratio:  1.28 Kirk Ruths MD Electronically signed by Kirk Ruths MD Signature Date/Time: 04/30/2019/4:45:11 PM    Final     Cardiac Studies     Patient Profile     Broughton Eppinger a 54 y.o.malewith a history oftwoself-reported MIs(cardiac catheterization reportedly showed normal coronaries), stroke,hypertension non-compliant with medications, recently diagnosed type 2diabetes mellitus, sleep apneanot on CPAP, and polysubstance abuse (tobacco, alcohol, and cocaine) who presents today for chest pain.  Assessment & Plan    1. Chest pain with elevated troponin - in setting of severe HTN, SBPs >200 - peak trop 325, slow downtrend then recurrent uptrend to 318 - EKG LVH,lateral and inferior TWIs possible strain - echo severe LVH, grade II DDX, LVEF 50-55%. Speckled appearance of myocardium reported, recs for MRI or PYP scan. Can plan on as outpatient - Patient states he has had two MIs before but reportedly had no CAD on cardiac catheterization either time. Both of these  episodes occurred at outside hospitals - CTA no aortic dissection - symptoms atypical, had some relief with GI cocktail yesterday  - from admission note plan is for cath on Monday given overall findings - current medical therapy with norvasc 5, ASA 81, atorva 80, losartan 50. No beta blocker due to bradycardia    2. History of substance abuse - patien reports last time he used cocaine was 2 months ago -  UDS here not collected, reorder   3. Hypertensive urgency - SBPs in 220s on admission, noncompliant with home meds - remains elevated, increase losartan to 100mg  daily   We will need to get social workers or case workers involved to see if we can help him with medications and if there is any way he can get help with CPAP before discharge. Social situation: He is temporarily in [place] and could benefit from primary care provider if he is going to be staying locally for any length of time   I have reviewed the risks, indications, and alternatives to cardiac catheterization, possible angioplasty, and stenting with the patient today. Risks include but are not limited to bleeding, infection, vascular injury, stroke, myocardial infection, arrhythmia, kidney injury, radiation-related injury in the case of prolonged fluoroscopy use, emergency cardiac surgery, and death. The patient understands the risks of serious complication is 1-2 in 1000 with diagnostic cardiac cath and 1-2% or less with angioplasty/stenting.   For questions or updates, please contact [facility] Please consult [email or web address] for contact info under        Signed, [place], MD  05/02/2019, 8:00 AM

## 2019-05-02 NOTE — Progress Notes (Signed)
Progress Note  Patient Name: Curtis Wilkins Date of Encounter: 05/02/2019  Primary Cardiologist: New  Subjective   Mild chest pain left sided  Inpatient Medications    Scheduled Meds: . amLODipine  10 mg Oral Daily  . aspirin  324 mg Oral Once  . aspirin EC  81 mg Oral Daily  . atorvastatin  80 mg Oral q1800  . enoxaparin (LOVENOX) injection  40 mg Subcutaneous Q24H  . insulin aspart  0-15 Units Subcutaneous TID WC  . losartan  50 mg Oral Daily   Continuous Infusions:  PRN Meds: acetaminophen, hydrALAZINE, morphine injection, nitroGLYCERIN, ondansetron (ZOFRAN) IV   Vital Signs    Vitals:   05/01/19 2114 05/01/19 2143 05/02/19 0336 05/02/19 0500  BP: (!) 178/103 (!) 176/108 (!) 169/106 (!) 147/107  Pulse: 79 71  63  Resp:  17  17  Temp:  98 F (36.7 C)  97.6 F (36.4 C)  TempSrc:  Oral  Oral  SpO2: 95% 98%  98%  Weight:    90.2 kg  Height:        Intake/Output Summary (Last 24 hours) at 05/02/2019 0800 Last data filed at 05/02/2019 0100 Gross per 24 hour  Intake 840 ml  Output 1625 ml  Net -785 ml   Last 3 Weights 05/02/2019 04/30/2019 04/15/2019  Weight (lbs) 198 lb 14.4 oz 190 lb 190 lb  Weight (kg) 90.22 kg 86.183 kg 86.183 kg      Telemetry    SR - Personally Reviewed  ECG    n/a- Personally Reviewed  Physical Exam   GEN: No acute distress.   Neck: No JVD Cardiac: RRR, no murmurs, rubs, or gallops.  Respiratory: Clear to auscultation bilaterally. GI: Soft, nontender, non-distended  MS: No edema; No deformity. Neuro:  Nonfocal  Psych: Normal affect   Labs    High Sensitivity Troponin:   Recent Labs  Lab 04/30/19 0832 04/30/19 1059 04/30/19 2030 04/30/19 2235  TROPONINIHS 325* 286* 292* 318*      Chemistry Recent Labs  Lab 04/30/19 0832 05/01/19 0345  NA 139 140  K 3.3* 3.6  CL 103 103  CO2 26 27  GLUCOSE 192* 133*  BUN 15 13  CREATININE 1.08 1.09  CALCIUM 9.2 8.7*  GFRNONAA >60 >60  GFRAA >60 >60  ANIONGAP 10 10      Hematology Recent Labs  Lab 04/30/19 0832  WBC 7.2  RBC 4.67  HGB 12.9*  HCT 41.4  MCV 88.7  MCH 27.6  MCHC 31.2  RDW 14.3  PLT 272    BNPNo results for input(s): BNP, PROBNP in the last 168 hours.   DDimer No results for input(s): DDIMER in the last 168 hours.   Radiology    DG Chest 2 View  Result Date: 04/30/2019 CLINICAL DATA:  Chest pain. EXAM: CHEST - 2 VIEW COMPARISON:  None. FINDINGS: Mild cardiomegaly. No pneumothorax or pleural effusion is noted. Both lungs are clear. The visualized skeletal structures are unremarkable. IMPRESSION: No active cardiopulmonary disease. Electronically Signed   By: Lupita Raider M.D.   On: 04/30/2019 09:13   CT Angio Chest Aorta w/CM &/OR wo/CM  Result Date: 04/30/2019 CLINICAL DATA:  Chest pain EXAM: CT ANGIOGRAPHY CHEST WITH CONTRAST TECHNIQUE: Multidetector CT imaging of the chest was performed using the standard protocol during bolus administration of intravenous contrast. Multiplanar CT image reconstructions and MIPs were obtained to evaluate the vascular anatomy. CONTRAST:  21mL OMNIPAQUE IOHEXOL 350 MG/ML SOLN COMPARISON:  None. FINDINGS: Cardiovascular:  Preferential opacification of the thoracic aorta. There is no evidence of aortic dissection. Ectasia of the ascending thoracic aorta measuring 3.9 cm. No aneurysm. Normal heart size. No pericardial effusion. Mediastinum/Nodes: There is no hilar, mediastinal, or axillary adenopathy. Lungs/Pleura: Mild bibasilar atelectasis. No consolidation or mass. No pleural effusion or pneumothorax. Upper Abdomen: No acute abnormality. Musculoskeletal: No chest wall abnormality. No acute or significant osseous findings. Review of the MIP images confirms the above findings. IMPRESSION: No evidence of aortic dissection or other acute abnormality. Electronically Signed   By: Guadlupe Spanish M.D.   On: 04/30/2019 13:18   ECHOCARDIOGRAM COMPLETE  Result Date: 04/30/2019    ECHOCARDIOGRAM REPORT   Patient  Name:   Curtis Wilkins Date of Exam: 04/30/2019 Medical Rec #:  [id number]      Height:       69.0 in Accession #:    [id number]     Weight:       190.0 lb Date of Birth:  [birth year]       BSA:          2.021 m Patient Age:    54 years       BP:           140/97 mmHg Patient Gender: M              HR:           52 bpm. Exam Location:  Inpatient Procedure: 2D Echo Indications:    Chest Pain R07.9  History:        Patient has no prior history of Echocardiogram examinations.                 Risk Factors:Hypertension, Diabetes, Current Smoker and Cocaine                 abuse.  Sonographer:    Thurman Coyer RDCS (AE) Referring Phys: [id number] CALLIE E GOODRICH IMPRESSIONS  1. Low normal LV systolic function; severe LVH; grade 2 diastolic dysfunction; mild LAE; trace AI; speckled appearance of myocardium; suggest cardiac MRI or PYP scan to R/O amyloid.  2. Left ventricular ejection fraction, by estimation, is 50 to 55%. The left ventricle has low normal function. The left ventricle has no regional wall motion abnormalities. There is severe left ventricular hypertrophy. Left ventricular diastolic parameters are consistent with Grade II diastolic dysfunction (pseudonormalization). Elevated left atrial pressure.  3. Right ventricular systolic function is normal. The right ventricular size is normal.  4. Left atrial size was mildly dilated.  5. The mitral valve is normal in structure and function. Trivial mitral valve regurgitation. No evidence of mitral stenosis.  6. The aortic valve is tricuspid. Aortic valve regurgitation is trivial. No aortic stenosis is present.  7. The inferior vena cava is normal in size with greater than 50% respiratory variability, suggesting right atrial pressure of 3 mmHg. FINDINGS  Left Ventricle: Left ventricular ejection fraction, by estimation, is 50 to 55%. The left ventricle has low normal function. The left ventricle has no regional wall motion abnormalities. The left ventricular internal  cavity size was normal in size. There is severe left ventricular hypertrophy. Left ventricular diastolic parameters are consistent with Grade II diastolic dysfunction (pseudonormalization). Elevated left atrial pressure. Right Ventricle: The right ventricular size is normal. Right ventricular systolic function is normal. Left Atrium: Left atrial size was mildly dilated. Right Atrium: Right atrial size was normal in size. Pericardium: Trivial pericardial effusion is present. Mitral Valve: The mitral valve is normal in  structure and function. Normal mobility of the mitral valve leaflets. Trivial mitral valve regurgitation. No evidence of mitral valve stenosis. Tricuspid Valve: The tricuspid valve is normal in structure. Tricuspid valve regurgitation is trivial. No evidence of tricuspid stenosis. Aortic Valve: The aortic valve is tricuspid. Aortic valve regurgitation is trivial. No aortic stenosis is present. Pulmonic Valve: The pulmonic valve was normal in structure. Pulmonic valve regurgitation is not visualized. No evidence of pulmonic stenosis. Aorta: The aortic root is normal in size and structure. Venous: The inferior vena cava is normal in size with greater than 50% respiratory variability, suggesting right atrial pressure of 3 mmHg. IAS/Shunts: No atrial level shunt detected by color flow Doppler. Additional Comments: Low normal LV systolic function; severe LVH; grade 2 diastolic dysfunction; mild LAE; trace AI; speckled appearance of myocardium; suggest cardiac MRI or PYP scan to R/O amyloid.  LEFT VENTRICLE PLAX 2D LVIDd:         4.80 cm  Diastology LVIDs:         3.20 cm  LV e' lateral:   4.57 cm/s LV PW:         2.00 cm  LV E/e' lateral: 20.6 LV IVS:        1.70 cm  LV e' medial:    4.13 cm/s LVOT diam:     2.30 cm  LV E/e' medial:  22.8 LV SV:         88 LV SV Index:   43 LVOT Area:     4.15 cm  RIGHT VENTRICLE RV S prime:     13.40 cm/s TAPSE (M-mode): 2.1 cm LEFT ATRIUM             Index       RIGHT  ATRIUM           Index LA diam:        4.80 cm 2.37 cm/m  RA Area:     15.50 cm LA Vol (A2C):   51.5 ml 25.48 ml/m RA Volume:   37.10 ml  18.35 ml/m LA Vol (A4C):   77.8 ml 38.49 ml/m LA Biplane Vol: 65.5 ml 32.40 ml/m  AORTIC VALVE LVOT Vmax:   96.90 cm/s LVOT Vmean:  65.600 cm/s LVOT VTI:    0.211 m  AORTA Ao Root diam: 3.60 cm MITRAL VALVE MV Area (PHT): 2.66 cm    SHUNTS MV Decel Time: 285 msec    Systemic VTI:  0.21 m MV E velocity: 94.00 cm/s  Systemic Diam: 2.30 cm MV A velocity: 73.70 cm/s MV E/A ratio:  1.28 Kirk Ruths MD Electronically signed by Kirk Ruths MD Signature Date/Time: 04/30/2019/4:45:11 PM    Final     Cardiac Studies     Patient Profile     Broughton Eppinger a 54 y.o.malewith a history oftwoself-reported MIs(cardiac catheterization reportedly showed normal coronaries), stroke,hypertension non-compliant with medications, recently diagnosed type 2diabetes mellitus, sleep apneanot on CPAP, and polysubstance abuse (tobacco, alcohol, and cocaine) who presents today for chest pain.  Assessment & Plan    1. Chest pain with elevated troponin - in setting of severe HTN, SBPs >200 - peak trop 325, slow downtrend then recurrent uptrend to 318 - EKG LVH,lateral and inferior TWIs possible strain - echo severe LVH, grade II DDX, LVEF 50-55%. Speckled appearance of myocardium reported, recs for MRI or PYP scan. Can plan on as outpatient - Patient states he has had two MIs before but reportedly had no CAD on cardiac catheterization either time. Both of these  episodes occurred at outside hospitals - CTA no aortic dissection - symptoms atypical, had some relief with GI cocktail yesterday  - from admission note plan is for cath on Monday given overall findings - current medical therapy with norvasc 5, ASA 81, atorva 80, losartan 50. No beta blocker due to bradycardia    2. History of substance abuse - patien reports last time he used cocaine was 2 months ago -  UDS here not collected, reorder   3. Hypertensive urgency - SBPs in 220s on admission, noncompliant with home meds - remains elevated, increase losartan to 100mg daily   We will need to get social workers or case workers involved to see if we can help him with medications and if there is any way he can get help with CPAP before discharge. Social situation: He is temporarily in [place] and could benefit from primary care provider if he is going to be staying locally for any length of time   I have reviewed the risks, indications, and alternatives to cardiac catheterization, possible angioplasty, and stenting with the patient today. Risks include but are not limited to bleeding, infection, vascular injury, stroke, myocardial infection, arrhythmia, kidney injury, radiation-related injury in the case of prolonged fluoroscopy use, emergency cardiac surgery, and death. The patient understands the risks of serious complication is 1-2 in 1000 with diagnostic cardiac cath and 1-2% or less with angioplasty/stenting.   For questions or updates, please contact [facility] Please consult [email or web address] for contact info under        Signed, Avayah Raffety, MD  05/02/2019, 8:00 AM    

## 2019-05-02 NOTE — Plan of Care (Signed)
  Problem: Education: Goal: Knowledge of General Education information will improve Description: Including pain rating scale, medication(s)/side effects and non-pharmacologic comfort measures Outcome: Progressing  Patient states he manages his blood sugar at home without insulin, refusing insulin.  Problem: Education: Goal: Understanding of cardiac disease, CV risk reduction, and recovery process will improve Outcome: Progressing  Education provided to call staff in event of any chest pain or shortness of breath.  Problem: Activity: Goal: Ability to tolerate increased activity will improve Outcome: Progressing  Patient up to bathroom several times without SOB or chest pain.

## 2019-05-03 ENCOUNTER — Inpatient Hospital Stay (HOSPITAL_COMMUNITY)
Admission: EM | Disposition: A | Discharge status: Home / Self Care | Payer: Self-pay | Source: Home / Self Care | Attending: Cardiology

## 2019-05-03 ENCOUNTER — Encounter: Payer: Self-pay | Admitting: Physician Assistant

## 2019-05-03 ENCOUNTER — Encounter (HOSPITAL_COMMUNITY): Payer: Self-pay | Admitting: Cardiology

## 2019-05-03 DIAGNOSIS — R931 Abnormal findings on diagnostic imaging of heart and coronary circulation: Secondary | ICD-10-CM

## 2019-05-03 DIAGNOSIS — R778 Other specified abnormalities of plasma proteins: Secondary | ICD-10-CM

## 2019-05-03 DIAGNOSIS — F1911 Other psychoactive substance abuse, in remission: Secondary | ICD-10-CM

## 2019-05-03 DIAGNOSIS — E119 Type 2 diabetes mellitus without complications: Secondary | ICD-10-CM

## 2019-05-03 DIAGNOSIS — I471 Supraventricular tachycardia: Secondary | ICD-10-CM

## 2019-05-03 DIAGNOSIS — R7989 Other specified abnormal findings of blood chemistry: Secondary | ICD-10-CM

## 2019-05-03 DIAGNOSIS — R071 Chest pain on breathing: Secondary | ICD-10-CM

## 2019-05-03 DIAGNOSIS — I10 Essential (primary) hypertension: Secondary | ICD-10-CM

## 2019-05-03 DIAGNOSIS — Z9114 Patient's other noncompliance with medication regimen: Secondary | ICD-10-CM

## 2019-05-03 DIAGNOSIS — G4733 Obstructive sleep apnea (adult) (pediatric): Secondary | ICD-10-CM

## 2019-05-03 HISTORY — PX: LEFT HEART CATH AND CORONARY ANGIOGRAPHY: CATH118249

## 2019-05-03 LAB — BASIC METABOLIC PANEL
Anion gap: 8 (ref 5–15)
BUN: 8 mg/dL (ref 6–20)
CO2: 28 mmol/L (ref 22–32)
Calcium: 8.7 mg/dL — ABNORMAL LOW (ref 8.9–10.3)
Chloride: 101 mmol/L (ref 98–111)
Creatinine, Ser: 0.96 mg/dL (ref 0.61–1.24)
GFR calc Af Amer: >60 mL/min (ref >60)
GFR calc non Af Amer: >60 mL/min (ref >60)
Glucose, Bld: 126 mg/dL — ABNORMAL HIGH (ref 70–99)
Potassium: 3.7 mmol/L (ref 3.5–5.1)
Sodium: 137 mmol/L (ref 135–145)

## 2019-05-03 LAB — MAGNESIUM: Magnesium: 1.9 mg/dL (ref 1.7–2.4)

## 2019-05-03 LAB — GLUCOSE, CAPILLARY
Glucose-Capillary: 111 mg/dL — ABNORMAL HIGH (ref 70–99)
Glucose-Capillary: 118 mg/dL — ABNORMAL HIGH (ref 70–99)
Glucose-Capillary: 127 mg/dL — ABNORMAL HIGH (ref 70–99)

## 2019-05-03 LAB — TSH: TSH: 1.07 u[IU]/mL (ref 0.350–4.500)

## 2019-05-03 SURGERY — LEFT HEART CATH AND CORONARY ANGIOGRAPHY
Anesthesia: LOCAL

## 2019-05-03 MED ORDER — TRAZODONE HCL 50 MG PO TABS: 150.0000 mg | ORAL_TABLET | Freq: Every day | ORAL | Status: DC

## 2019-05-03 MED ORDER — POTASSIUM CHLORIDE CRYS ER 20 MEQ PO TBCR: 40.0000 meq | EXTENDED_RELEASE_TABLET | Freq: Every day | ORAL | 6 refills | Status: AC

## 2019-05-03 MED ORDER — HEPARIN (PORCINE) IN NACL 1000-0.9 UT/500ML-% IV SOLN
INTRAVENOUS | Status: DC | PRN
  Administered 2019-05-03: 500 mL

## 2019-05-03 MED ORDER — LOSARTAN POTASSIUM 100 MG PO TABS: 100.0000 mg | ORAL_TABLET | Freq: Every day | ORAL | 6 refills | Status: AC

## 2019-05-03 MED ORDER — AMLODIPINE BESYLATE 10 MG PO TABS: 10.0000 mg | ORAL_TABLET | Freq: Every day | ORAL | 6 refills | Status: AC

## 2019-05-03 MED ORDER — ASPIRIN 81 MG PO TBEC: 81.0000 mg | DELAYED_RELEASE_TABLET | Freq: Every day | ORAL | 6 refills | Status: AC

## 2019-05-03 MED ORDER — LISINOPRIL 40 MG PO TABS: 40.0000 mg | ORAL_TABLET | Freq: Every day | ORAL | Status: DC

## 2019-05-03 MED ORDER — SODIUM CHLORIDE 0.9% FLUSH: 3.0000 mL | INTRAVENOUS | Status: DC | PRN

## 2019-05-03 MED ORDER — HEPARIN (PORCINE) IN NACL 1000-0.9 UT/500ML-% IV SOLN
INTRAVENOUS | Status: AC
  Filled 2019-05-03: qty 500

## 2019-05-03 MED ORDER — ISOSORBIDE MONONITRATE ER 60 MG PO TB24: 60.0000 mg | ORAL_TABLET | Freq: Every day | ORAL | Status: DC

## 2019-05-03 MED ORDER — HYDRALAZINE HCL 20 MG/ML IJ SOLN: 10.0000 mg | INTRAMUSCULAR | Status: DC | PRN

## 2019-05-03 MED ORDER — IOHEXOL 350 MG/ML SOLN
INTRAVENOUS | Status: AC
  Filled 2019-05-03: qty 1

## 2019-05-03 MED ORDER — DIVALPROEX SODIUM ER 500 MG PO TB24: 1000.0000 mg | ORAL_TABLET | Freq: Every day | ORAL | Status: DC

## 2019-05-03 MED ORDER — HEPARIN SODIUM (PORCINE) 1000 UNIT/ML IJ SOLN
INTRAMUSCULAR | Status: DC | PRN
  Administered 2019-05-03: 4500 [IU] via INTRAVENOUS

## 2019-05-03 MED ORDER — SERTRALINE HCL 100 MG PO TABS: 100.0000 mg | ORAL_TABLET | Freq: Every day | ORAL | Status: DC

## 2019-05-03 MED ORDER — SODIUM CHLORIDE 0.9 % IV SOLN: INTRAVENOUS | Status: DC

## 2019-05-03 MED ORDER — LIDOCAINE HCL (PF) 1 % IJ SOLN
INTRAMUSCULAR | Status: DC | PRN
  Administered 2019-05-03: 2 mL

## 2019-05-03 MED ORDER — HYDRALAZINE HCL 20 MG/ML IJ SOLN
10.0000 mg | Freq: Once | INTRAMUSCULAR | Status: AC
  Administered 2019-05-03: 10 mg via INTRAVENOUS
  Filled 2019-05-03: qty 1

## 2019-05-03 MED ORDER — ATORVASTATIN CALCIUM 40 MG PO TABS: 40.0000 mg | ORAL_TABLET | Freq: Every evening | ORAL | 6 refills | Status: AC

## 2019-05-03 MED ORDER — VERAPAMIL HCL 2.5 MG/ML IV SOLN
INTRA_ARTERIAL | Status: DC | PRN
  Administered 2019-05-03: 10 mL via INTRA_ARTERIAL

## 2019-05-03 MED ORDER — LABETALOL HCL 5 MG/ML IV SOLN: 10.0000 mg | INTRAVENOUS | Status: DC | PRN

## 2019-05-03 MED ORDER — HYDROCHLOROTHIAZIDE 25 MG PO TABS: 25.0000 mg | ORAL_TABLET | Freq: Every day | ORAL | Status: DC

## 2019-05-03 MED ORDER — METFORMIN HCL 500 MG PO TABS: 500.0000 mg | ORAL_TABLET | Freq: Two times a day (BID) | ORAL | 1 refills | Status: AC

## 2019-05-03 MED ORDER — SODIUM CHLORIDE 0.9% FLUSH: 3.0000 mL | Freq: Two times a day (BID) | INTRAVENOUS | Status: DC

## 2019-05-03 MED ORDER — LIDOCAINE HCL (PF) 1 % IJ SOLN
INTRAMUSCULAR | Status: AC
  Filled 2019-05-03: qty 30

## 2019-05-03 MED ORDER — METHYLPREDNISOLONE SODIUM SUCC 125 MG IJ SOLR
INTRAMUSCULAR | Status: DC | PRN
  Administered 2019-05-03: 125 mg via INTRAVENOUS

## 2019-05-03 MED ORDER — NITROGLYCERIN 1 MG/10 ML FOR IR/CATH LAB
INTRA_ARTERIAL | Status: AC
  Filled 2019-05-03: qty 10

## 2019-05-03 MED ORDER — IOHEXOL 350 MG/ML SOLN
INTRAVENOUS | Status: DC | PRN
  Administered 2019-05-03: 40 mL via INTRA_ARTERIAL

## 2019-05-03 MED ORDER — SODIUM CHLORIDE 0.9 % IV SOLN: 250.0000 mL | INTRAVENOUS | Status: DC | PRN

## 2019-05-03 MED ORDER — HEPARIN SODIUM (PORCINE) 1000 UNIT/ML IJ SOLN
INTRAMUSCULAR | Status: AC
  Filled 2019-05-03: qty 1

## 2019-05-03 MED ORDER — MORPHINE SULFATE (PF) 2 MG/ML IV SOLN: 2.0000 mg | INTRAVENOUS | Status: DC | PRN

## 2019-05-03 MED ORDER — CHLORTHALIDONE 25 MG PO TABS: 25.0000 mg | ORAL_TABLET | Freq: Every day | ORAL | Status: DC

## 2019-05-03 MED ORDER — CHLORTHALIDONE 25 MG PO TABS: 25.0000 mg | ORAL_TABLET | Freq: Every day | ORAL | 6 refills | Status: AC

## 2019-05-03 MED ORDER — VERAPAMIL HCL 2.5 MG/ML IV SOLN
INTRAVENOUS | Status: AC
  Filled 2019-05-03: qty 2

## 2019-05-03 MED ORDER — ONDANSETRON HCL 4 MG/2ML IJ SOLN: 4.0000 mg | Freq: Four times a day (QID) | INTRAMUSCULAR | Status: DC | PRN

## 2019-05-03 MED ORDER — POTASSIUM CHLORIDE CRYS ER 20 MEQ PO TBCR: 40.0000 meq | EXTENDED_RELEASE_TABLET | Freq: Every day | ORAL | Status: DC

## 2019-05-03 MED ORDER — METHYLPREDNISOLONE SODIUM SUCC 125 MG IJ SOLR
INTRAMUSCULAR | Status: AC
  Filled 2019-05-03: qty 2

## 2019-05-03 MED ORDER — DIPHENHYDRAMINE HCL 50 MG/ML IJ SOLN
INTRAMUSCULAR | Status: AC
  Filled 2019-05-03: qty 1

## 2019-05-03 MED ORDER — ACETAMINOPHEN 325 MG PO TABS: 650.0000 mg | ORAL_TABLET | ORAL | Status: DC | PRN

## 2019-05-03 MED FILL — POTASSIUM CHLORIDE 20meqER: 20 | 30 days supply | Qty: 60 | Fill #0

## 2019-05-03 MED FILL — CHLORTHALIDONE 25 MG TAB: 25 | 30 days supply | Qty: 30 | Fill #0

## 2019-05-03 MED FILL — metFORMIN HCL 500 MG TABS: 500 | 30 days supply | Qty: 60 | Fill #0

## 2019-05-03 MED FILL — LOSARTAN POTASSIUM 100 MG T: 100 | 30 days supply | Qty: 30 | Fill #0

## 2019-05-03 MED FILL — ASPIRIN 81 MG TBEC: 81 | 30 days supply | Qty: 30 | Fill #0

## 2019-05-03 MED FILL — AMLODIPINE BESYLATE 10 MG T: 10 | 30 days supply | Qty: 30 | Fill #0

## 2019-05-03 MED FILL — ATORVASTATIN CALCIUM 40 MG: 40 | 30 days supply | Qty: 30 | Fill #0

## 2019-05-03 SURGICAL SUPPLY — 11 items

## 2019-05-03 NOTE — Progress Notes (Addendum)
Progress Note  Patient Name: Curtis Wilkins Date of Encounter: 05/03/2019  Primary Cardiologist: Rollene Rotunda, MD  Subjective   Continues to have some slight chest pain when he sits upward in bed (the specific motion of doing so) and upon coughing. States he had inspirational CP on admission which has significantly improved. He is very eager for discharge today, stating he feels well enough. Very concerned about losing job. Has been sober for several months.  See care management note. Patient indicates he'll need all meds refilled at DC - will need to await whether candidate for [facility] program.  Inpatient Medications    Scheduled Meds: . amLODipine  10 mg Oral Daily  . aspirin  324 mg Oral Once  . aspirin EC  81 mg Oral Daily  . atorvastatin  80 mg Oral q1800  . enoxaparin (LOVENOX) injection  40 mg Subcutaneous Q24H  . insulin aspart  0-15 Units Subcutaneous TID WC  . losartan  100 mg Oral Daily  . sodium chloride flush  3 mL Intravenous Q12H   Continuous Infusions: . sodium chloride    . sodium chloride 1 mL/kg/hr (05/03/19 0513)   PRN Meds: sodium chloride, acetaminophen, hydrALAZINE, morphine injection, nitroGLYCERIN, ondansetron (ZOFRAN) IV, sodium chloride flush   Vital Signs    Vitals:   05/02/19 0500 05/02/19 1642 05/02/19 2007 05/03/19 0449  BP: (!) 147/107 (!) 147/93 (!) 183/121 (!) 160/106  Pulse: 63 83 79 (!) 56  Resp: 17 18 17 20   Temp: 97.6 F (36.4 C) 98.2 F (36.8 C) 98.1 F (36.7 C) 97.7 F (36.5 C)  TempSrc: Oral Oral Oral Oral  SpO2: 98% 99% 96% 92%  Weight: 90.2 kg   90.1 kg  Height:        Intake/Output Summary (Last 24 hours) at 05/03/2019 0710 Last data filed at 05/03/2019 0700 Gross per 24 hour  Intake 360 ml  Output 1875 ml  Net -1515 ml   Last 3 Weights 05/03/2019 05/02/2019 04/30/2019  Weight (lbs) 198 lb 9.6 oz 198 lb 14.4 oz 190 lb  Weight (kg) 90.084 kg 90.22 kg 86.183 kg     Telemetry    NSR brief run of narrow complex  tachycardia - Personally Reviewed  Physical Exam   GEN: No acute distress.  HEENT: Normocephalic, atraumatic, sclera non-icteric. Neck: No JVD or bruits. Cardiac: RRR no murmurs, rubs, or gallops.  Radials/DP/PT 1+ and equal bilaterally.  Respiratory: Clear to auscultation bilaterally. Breathing is unlabored. GI: Soft, nontender, non-distended, BS +x 4. MS: no deformity. Extremities: No clubbing or cyanosis. No edema. Distal pedal pulses are 2+ and equal bilaterally. Neuro:  AAOx3. Follows commands. Psych:  Responds to questions appropriately with a normal affect.  Labs    High Sensitivity Troponin:   Recent Labs  Lab 04/30/19 0832 04/30/19 1059 04/30/19 2030 04/30/19 2235  TROPONINIHS 325* 286* 292* 318*      Cardiac EnzymesNo results for input(s): TROPONINI in the last 168 hours. No results for input(s): TROPIPOC in the last 168 hours.   Chemistry Recent Labs  Lab 04/30/19 0832 05/01/19 0345  NA 139 140  K 3.3* 3.6  CL 103 103  CO2 26 27  GLUCOSE 192* 133*  BUN 15 13  CREATININE 1.08 1.09  CALCIUM 9.2 8.7*  GFRNONAA >60 >60  GFRAA >60 >60  ANIONGAP 10 10     Hematology Recent Labs  Lab 04/30/19 0832  WBC 7.2  RBC 4.67  HGB 12.9*  HCT 41.4  MCV 88.7  MCH  27.6  MCHC 31.2  RDW 14.3  PLT 272     Radiology    No results found.  Cardiac Studies   2D echo 04/30/19 1. Low normal LV systolic function; severe LVH; grade 2 diastolic  dysfunction; mild LAE; trace AI; speckled appearance of myocardium;  suggest cardiac MRI or PYP scan to R/O amyloid.  2. Left ventricular ejection fraction, by estimation, is 50 to 55%. The  left ventricle has low normal function. The left ventricle has no regional  wall motion abnormalities. There is severe left ventricular hypertrophy.  Left ventricular diastolic  parameters are consistent with Grade II diastolic dysfunction  (pseudonormalization). Elevated left atrial pressure.  3. Right ventricular systolic  function is normal. The right ventricular  size is normal.  4. Left atrial size was mildly dilated.  5. The mitral valve is normal in structure and function. Trivial mitral  valve regurgitation. No evidence of mitral stenosis.  6. The aortic valve is tricuspid. Aortic valve regurgitation is trivial.  No aortic stenosis is present.  7. The inferior vena cava is normal in size with greater than 50%  respiratory variability, suggesting right atrial pressure of 3 mmHg.   Patient Profile     Curtis Lindseyis a 54 y.o.malewith a history oftwoself-reported MIs at outside hospitals(cardiac catheterization reportedly showed normal coronaries), stroke,hypertension non-compliant with medications, recently diagnosed type 2diabetes mellitus, sleep apneanot on CPAP (unable to afford/lack of insurance), and polysubstance abuse (tobacco, alcohol, and cocaine) who presented to [facility] with chest pain (with mixed features), elevated troponin, and hypertensive urgency in context of noncompliance with home medications.  Assessment & Plan    1. Chest pain in setting of elevated troponin in setting of severe HTN - peak troponin 325. CT angio ruled out dissection; mildly ectatic aorta noted. Chest pain with mixed features - this was worse with inspiration, coughing, eating, and talking. As previously outlined, plan cath today for definitive assessment. Risks/benefits discussed already. Remains on ASA, statin. Not on BB due to baseline bradycardia. Do not see he was started on heparin as this was initially felt due to demand ischemia from hypertensive urgency. See also #2.  2. Abnormal echocardiogram with speckled appearance concerning for amyloid - per echo, recommendation is for cMRI or PYP scan as outpatient. Given lack of insurance seems patient would not be able to afford in outpatient setting. Will discuss with rounding MD. Doubt he will be willing to remain inpatient for study.  3. History of substance  abuse - patient reports last using cocaine 2 months ago. UDS + opiates (received morphine here) but no cocaine. Remains sober from ETOH as well. He is not on BB anyway due to baseline bradycardia.  4. Essential HTN - remains suboptimally controlled on amlodipine + losartan. No BB due to sinus bradycardia. Has been getting PRN oral hydralazine (10mg  tabs). Will ask for repeat post-hydralazine BP and plan review plan with Dr. Marlou Porch - consider addition of diuretic vs hydralazine. Was supposed to be on HCTZ as OP. Suspect this would have better compliance than TID hydralazine.  5. OSA - care management was consulted for CPAP but unfortunately options limited and not able to afford cost ($853). He also reported he had worn CPAP in the past and hated the mask. Likely contributing to HTN.  6. Brief run of narrow complex tachycardia - too short to characterize, but fairly regular - suspect brief SVT. Update BMET/Mg and obtain baseline TSH. Asymptomatic.  7. Diabetes mellitus - A1C 7.1. OP Metformin on  hold. Will need to hold 48 hours post cath. OP f/u recommended.  8. No insurance - see care management consult note. They arranged PCP f/u with [facility] and indicate their team will continue to follow for potential MATCH.   For questions or updates, please contact [facility] Please consult [email or web address] for contact info under Cardiology/STEMI.  Signed, Laurann Montana, PA-C 05/03/2019, 7:10 AM    Personally seen and examined. Agree with above.  Fairly disgruntled currently, waiting for catheterization.  Try to encourage patients for him.  .  Chest pain in the setting of severe hypertension awaiting cardiac catheterization today.  He has been reportedly sober from alcohol and cocaine.  No beta-blockers because of bradycardia.  Worried about social determinants, job loss for instance.  More to come following cardiac catheterization.  Donato Schultz, MD

## 2019-05-03 NOTE — Interval H&P Note (Signed)
Cath Lab Visit (complete for each Cath Lab visit)  Clinical Evaluation Leading to the Procedure:   ACS: Yes.    Non-ACS:    Anginal Classification: CCS III  Anti-ischemic medical therapy: Minimal Therapy (1 class of medications)  Non-Invasive Test Results: No non-invasive testing performed  Prior CABG: No previous CABG      History and Physical Interval Note:  05/03/2019 3:34 PM  Melven Sartorius  has presented today for surgery, with the diagnosis of NSTEMI.  The various methods of treatment have been discussed with the patient and family. After consideration of risks, benefits and other options for treatment, the patient has consented to  Procedure(s): LEFT HEART CATH AND CORONARY ANGIOGRAPHY (N/A) as a surgical intervention.  The patient's history has been reviewed, patient examined, no change in status, stable for surgery.  I have reviewed the patient's chart and labs.  Questions were answered to the patient's satisfaction.     Nanetta Batty

## 2019-05-03 NOTE — Care Management (Signed)
1535 05-03-19 Case Manager received a secure chat that the patient may need medication assistance leaving the hospital. MATCH to be completed and information will be provided to patient. The [facility] may be closed before the patient leaves- so the letter will be provided to the patient prior to transition. Patient will need printed Rx's, so the patient can take to designated pharmacies that participate in [facility].  Gala Lewandowsky, RN,BSN Case Manager

## 2019-05-03 NOTE — Progress Notes (Addendum)
Per Dr. Allyson Sabal - cath showed clean cors - d/w CM - they will be able to fill meds at [facility] pharmacy prior to dc using MATCH. Agostino Gorin PA-C

## 2019-05-03 NOTE — Progress Notes (Addendum)
I arrived to the floor to see another patient and Mr. Guevarra requested to speak with me. He is very frustrated about the delay in his heart cath and requested to leave AMA. We discussed the reason for the heart cath and importance of staying for the procedure. Ultimately it is his decision but we discussed the various outcomes and he is agreeable to staying. I did discuss plan for his BP with Dr. Anne Fu preemptively. On max dose amlodipine and losartan, unable to use BB due to baseline bradycardia, has been getting intermittent dosing of PRN 10mg  oral hydralazine (without much effect). Would hope to avoid diuretic acutely pre-cath but postcath anticipate starting chlorthalidone 25mg  once daily with KCl daily - orders written for tomorrow if still here. SBP remains 156/107 and may be problematic with impending cath. Dr. suggests 10mg  IV hydralazine in the short term for BP control. Nurse made aware. Albin Duckett PA-C
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
## 2019-05-03 NOTE — Progress Notes (Signed)
   Primary Cardiologist: Rollene Rotunda, MD  To whom it may concerns:  Mr. Curtis Wilkins [facility] December 31, 1965) was admitted to the [facility] on 3/5 and was discharged on 3/8. Due to the intravascular procedure that was performed during the hospitalization, we recommend him to hold off on returning to work until 05/10/2019 at which time he may work without any restriction.   Please call us for any questions.  Azalee Course, [place]  05/03/2019, 6:20 PM [facility] [address], [place], [place] [address] [phone]

## 2019-05-04 MED FILL — Diphenhydramine HCl Inj 50 MG/ML: INTRAMUSCULAR | Qty: 1 | Status: AC
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
## 2019-05-10 ENCOUNTER — Other Ambulatory Visit: Payer: Self-pay

## 2019-05-10 ENCOUNTER — Encounter: Payer: Self-pay | Admitting: Nurse Practitioner

## 2019-05-10 ENCOUNTER — Ambulatory Visit (INDEPENDENT_AMBULATORY_CARE_PROVIDER_SITE_OTHER): Payer: Self-pay | Admitting: Nurse Practitioner

## 2019-05-10 VITALS — BP 146/92 | HR 98 | Ht 65.0 in | Wt 206.5 lb

## 2019-05-10 DIAGNOSIS — G4733 Obstructive sleep apnea (adult) (pediatric): Secondary | ICD-10-CM

## 2019-05-10 DIAGNOSIS — Z7189 Other specified counseling: Secondary | ICD-10-CM

## 2019-05-10 DIAGNOSIS — I10 Essential (primary) hypertension: Secondary | ICD-10-CM

## 2019-05-10 DIAGNOSIS — R931 Abnormal findings on diagnostic imaging of heart and coronary circulation: Secondary | ICD-10-CM

## 2019-05-10 DIAGNOSIS — I517 Cardiomegaly: Secondary | ICD-10-CM

## 2019-05-10 DIAGNOSIS — R079 Chest pain, unspecified: Secondary | ICD-10-CM

## 2019-05-10 LAB — BASIC METABOLIC PANEL
BUN/Creatinine Ratio: 16 (ref 9–20)
BUN: 17 mg/dL (ref 6–24)
CO2: 24 mmol/L (ref 20–29)
Calcium: 10 mg/dL (ref 8.7–10.2)
Chloride: 95 mmol/L — ABNORMAL LOW (ref 96–106)
Creatinine, Ser: 1.04 mg/dL (ref 0.76–1.27)
GFR calc Af Amer: 94 mL/min/{1.73_m2} (ref >59)
GFR calc non Af Amer: 81 mL/min/{1.73_m2} (ref >59)
Glucose: 227 mg/dL — ABNORMAL HIGH (ref 65–99)
Potassium: 4.3 mmol/L (ref 3.5–5.2)
Sodium: 136 mmol/L (ref 134–144)

## 2019-05-10 NOTE — Patient Instructions (Addendum)
After Visit Summary:  We will be checking the following labs today - BMET    Medication Instructions:    Continue with your current medicines.    If you need a refill on your cardiac medications before your next appointment, please call your pharmacy.     Testing/Procedures To Be Arranged:  N/A  Follow-Up:   See Dr. Antoine Poche in about 2 weeks - need to discuss how to get follow up scans for your heart    At [facility], you and your health needs are our priority.  As part of our continuing mission to provide you with exceptional heart care, we have created designated Provider Care Teams.  These Care Teams include your primary Cardiologist (physician) and Advanced Practice Providers (APPs -  Physician Assistants and Nurse Practitioners) who all work together to provide you with the care you need, when you need it.  Special Instructions:  . Stay safe, stay home, wash your hands for at least 20 seconds and wear a mask when out in public.  . It was good to talk with you today.  . Find out if your employer has you on health insurance - bring your card to [place] to your next appointment.    Call the [facility] office at [phone] if you have any questions, problems or concerns.

## 2019-05-13 ENCOUNTER — Telehealth: Payer: Self-pay | Admitting: Licensed Clinical Social Worker

## 2019-05-13 NOTE — Telephone Encounter (Signed)
CSW received referral to assist patient with resources. Unfortunately, patient does not have a phone number listed in the MRN and CSW unable to contact. Please have patient call CSW for further assistance. Lasandra Beech, LCSW, CCSW-MCS [phone]

## 2019-05-18 ENCOUNTER — Encounter: Payer: Self-pay | Admitting: *Deleted

## 2019-05-19 ENCOUNTER — Ambulatory Visit: Payer: Self-pay | Admitting: Family Medicine

## 2019-06-03 DIAGNOSIS — I517 Cardiomegaly: Secondary | ICD-10-CM | POA: Insufficient documentation

## 2019-06-03 DIAGNOSIS — Z7189 Other specified counseling: Secondary | ICD-10-CM | POA: Insufficient documentation
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
## 2019-06-04 ENCOUNTER — Ambulatory Visit: Payer: Self-pay | Admitting: Cardiology

## 2020-09-12 IMAGING — DX DG CHEST 2V
2 series · 2 of 2 positions shown · non-contrast
Comparison: None.

CLINICAL DATA: Chest pain.

EXAM:
CHEST - 2 VIEW

[chest lat]
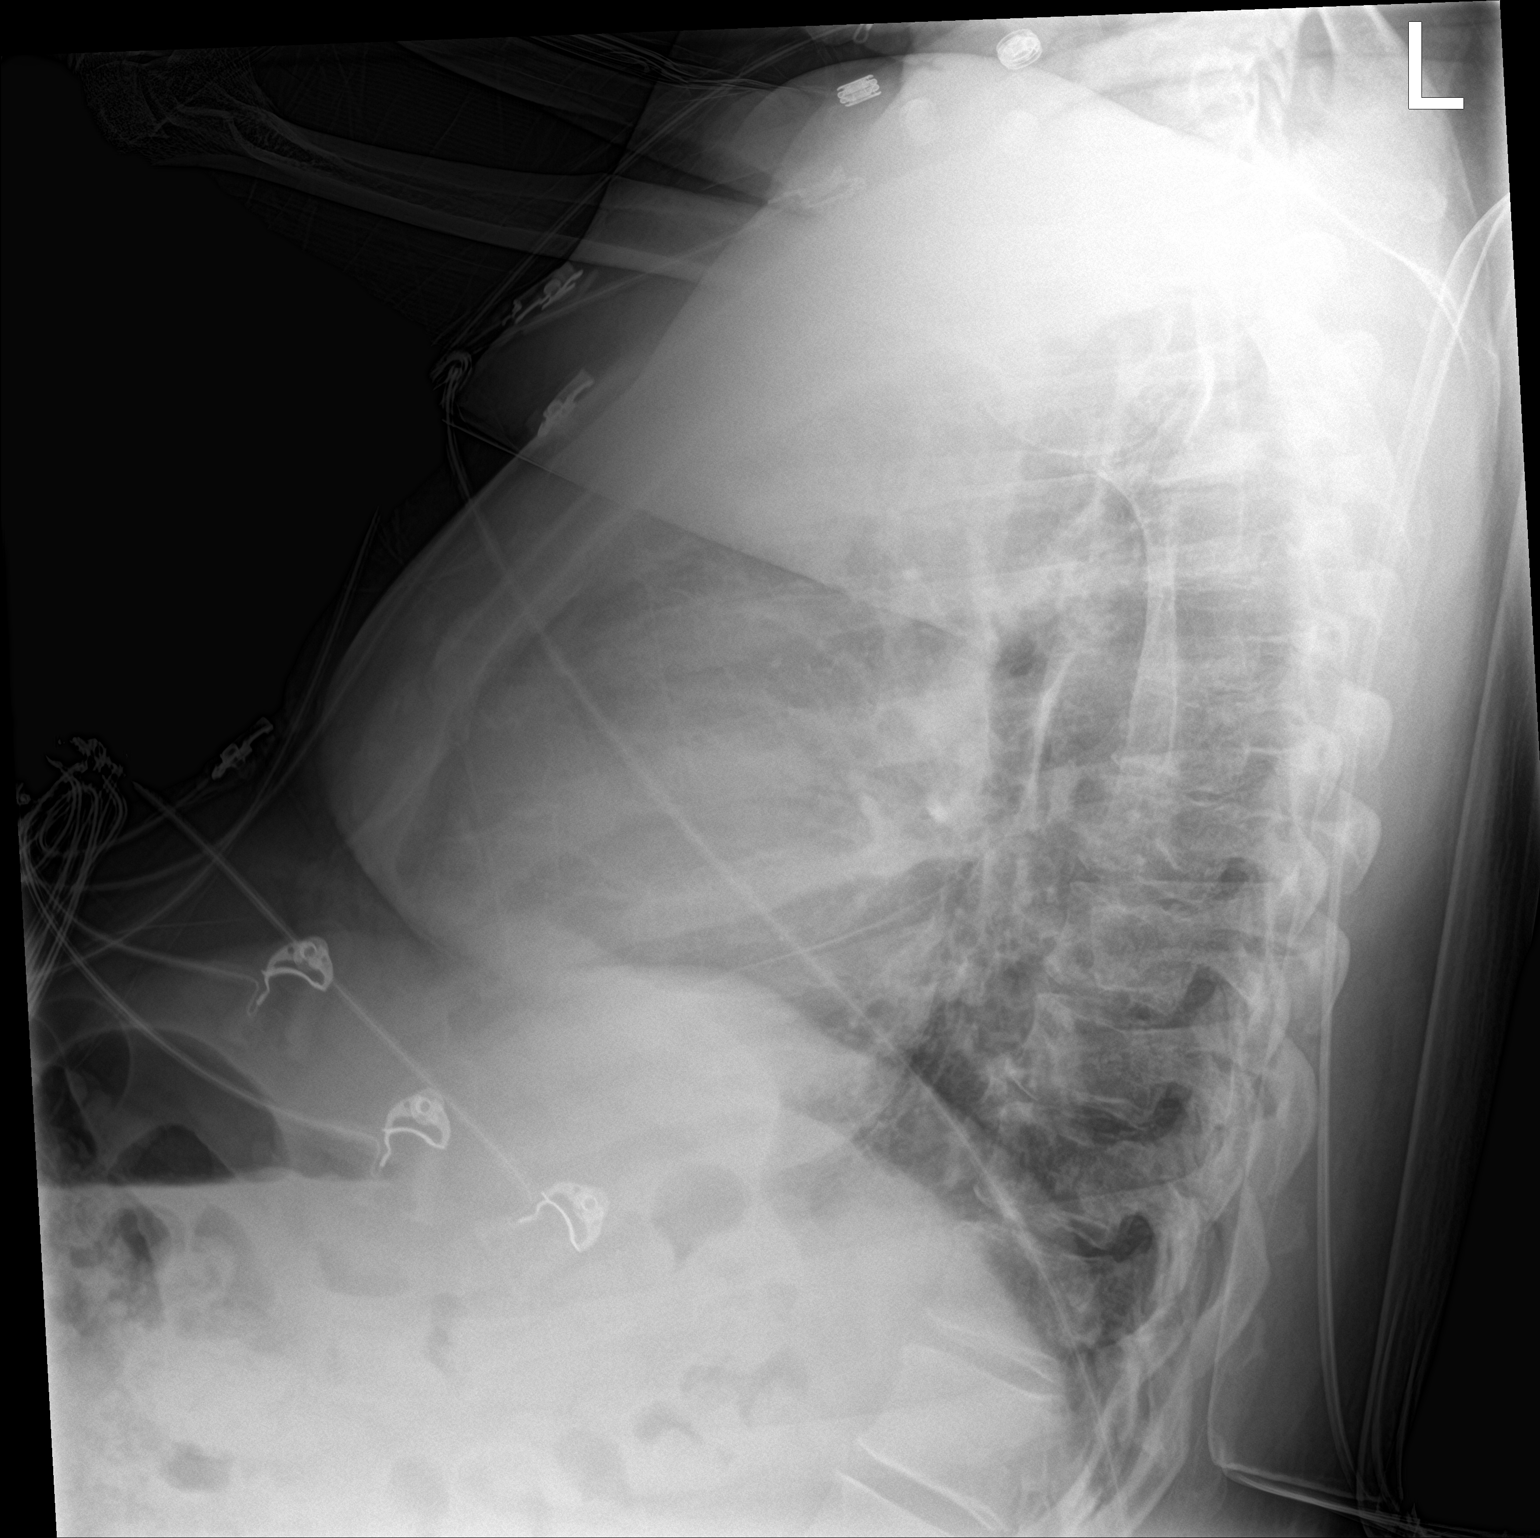

[chest ap]
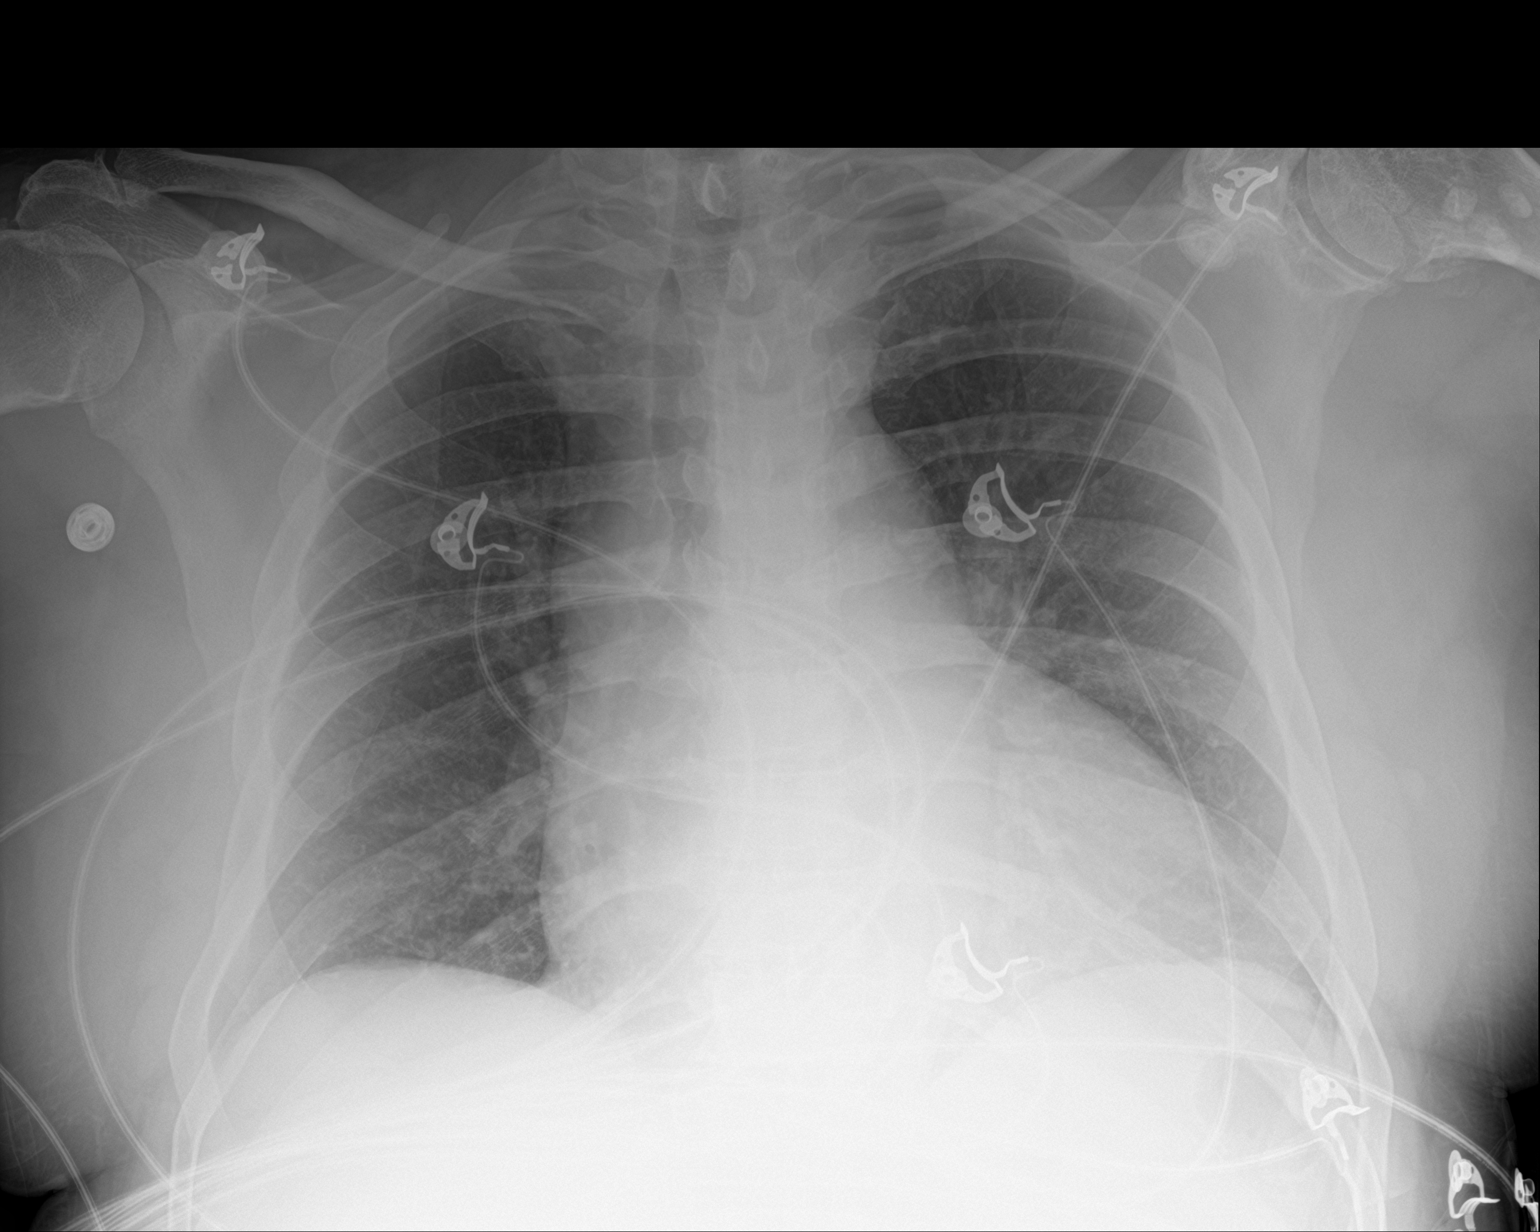

[2 of 2 positions shown; findings below may reference images not displayed]

FINDINGS: Mild cardiomegaly. No pneumothorax or pleural effusion is noted.
Both lungs are clear. The visualized skeletal structures are
unremarkable.
IMPRESSION: No active cardiopulmonary disease.
